# Patient Record
Sex: Male | Born: 1995 | Race: Black or African American | Hispanic: No | Marital: Single | Smoking: Never smoker
Health system: Southern US, Community
[De-identification: ages and names within clinical notes are randomized; demographics above are authoritative.]

---

## 2017-08-15 ENCOUNTER — Encounter (HOSPITAL_COMMUNITY): Payer: Self-pay

## 2017-08-15 ENCOUNTER — Other Ambulatory Visit: Payer: Self-pay

## 2017-08-15 ENCOUNTER — Emergency Department (HOSPITAL_COMMUNITY)
Admission: EM | Admit: 2017-08-15 | Discharge: 2017-08-15 | Disposition: A | Discharge status: Home / Self Care | Payer: Self-pay | Attending: Emergency Medicine | Admitting: Emergency Medicine

## 2017-08-15 DIAGNOSIS — Z7251 High risk heterosexual behavior: Secondary | ICD-10-CM

## 2017-08-15 DIAGNOSIS — F17210 Nicotine dependence, cigarettes, uncomplicated: Secondary | ICD-10-CM | POA: Insufficient documentation

## 2017-08-15 DIAGNOSIS — Z202 Contact with and (suspected) exposure to infections with a predominantly sexual mode of transmission: Secondary | ICD-10-CM | POA: Insufficient documentation

## 2017-08-15 LAB — URINALYSIS, ROUTINE W REFLEX MICROSCOPIC
BILIRUBIN URINE: NEGATIVE
GLUCOSE, UA: NEGATIVE mg/dL
HGB URINE DIPSTICK: NEGATIVE
Ketones, ur: NEGATIVE mg/dL
Leukocytes, UA: NEGATIVE
Nitrite: NEGATIVE
Protein, ur: NEGATIVE mg/dL
Specific Gravity, Urine: 1.004 — ABNORMAL LOW (ref 1.005–1.030)
pH: 6 (ref 5.0–8.0)

## 2017-08-15 NOTE — ED Provider Notes (Addendum)
[facility] EMERGENCY DEPARTMENT Provider Note   CSN: [id number] Arrival date & time: 08/15/17  0242    History   Chief Complaint Chief Complaint  Patient presents with  . Exposure to STD    HPI Roy Matthews is a 22 y.o. male.  22 year old male presents to the emergency department for evaluation of possible STD exposure.  He reports a sexual encounter with a male partner 2 days ago where he used a condom, but it broke during intercourse.  Patient denies any associated abdominal pain, fevers, nausea, vomiting, genital lesions, penile discharge, dysuria.  He states that he is solely concerned about the possibility of STDs.  No medications taken prior to arrival.     History reviewed. No pertinent past medical history.  There are no active problems to display for this patient.   History reviewed. No pertinent surgical history.      Home Medications    Prior to Admission medications   Not on File    Family History History reviewed. No pertinent family history.  Social History Social History   Tobacco Use  . Smoking status: Current Some Day Smoker  . Smokeless tobacco: Never Used  Substance Use Topics  . Alcohol use: Yes  . Drug use: Never     Allergies   Patient has no known allergies.   Review of Systems Review of Systems Ten systems reviewed and are negative for acute change, except as noted in the HPI.    Physical Exam Updated Vital Signs BP (!) 152/102 (BP Location: Right Arm)   Pulse (!) 111   Temp 98.8 F (37.1 C) (Oral)   Resp 16   Ht  (1.905 m)   Wt 72.6 kg (160 lb)   SpO2 98%   BMI 20.00 kg/m   Physical Exam  Constitutional: He is oriented to person, place, and time. He appears well-developed and well-nourished. No distress.  Nontoxic appearing and in NAD  HENT:  Head: Normocephalic and atraumatic.  Eyes: Conjunctivae and EOM are normal. No scleral icterus.  Neck: Normal range of motion.  Pulmonary/Chest:  Effort normal. No respiratory distress.  Respirations even and unlabored  Abdominal: He exhibits no distension.  Musculoskeletal: Normal range of motion.  Neurological: He is alert and oriented to person, place, and time. He exhibits normal muscle tone. Coordination normal.  Skin: Skin is warm and dry. No rash noted. He is not diaphoretic. No erythema. No pallor.  Psychiatric: He has a normal mood and affect. His behavior is normal.  Nursing note and vitals reviewed.    ED Treatments / Results  Labs (all labs ordered are listed, but only abnormal results are displayed) Labs Reviewed  URINALYSIS, ROUTINE W REFLEX MICROSCOPIC - Abnormal; Notable for the following components:      Result Value   Color, Urine STRAW (*)    Specific Gravity, Urine 1.004 (*)    All other components within normal limits  GC/CHLAMYDIA PROBE AMP ([place]) NOT AT [facility]    EKG None  Radiology No results found.  Procedures Procedures (including critical care time)  Medications Ordered in ED Medications - No data to display   Initial Impression / Assessment and Plan / ED Course  I have reviewed the triage vital signs and the nursing notes.  Pertinent labs & imaging results that were available during my care of the patient were reviewed by me and considered in my medical decision making (see chart for details).     22 year old male  presents to the emergency department for STD check.  He had a sexual encounter with a male partner 2 days ago where the condom broke during intercourse.  He has no symptomatic complaints today.  Urinalysis without pyuria or other evidence to suggest underlying STD.  Gonorrhea and Chlamydia cultures pending.  I do not believe he requires prophylactic treatment.  We will have the patient follow-up with the health department in 48 hours.  Return precautions discussed and provided. Patient discharged in stable condition with no unaddressed concerns.   Final Clinical  Impressions(s) / ED Diagnoses   Final diagnoses:  History of unprotected sex    ED Discharge Orders    None       Darylene Price 08/15/17 0615    Azalia Bilis, MD 08/15/17 0800    Antony Madura, PA-C 08/28/17 Caren Macadam    Azalia Bilis, MD 08/28/17 [phone]

## 2017-08-15 NOTE — Discharge Instructions (Addendum)
Your urine today did not suggest an acute infection. Follow-up with the health department in 48 hours for the results of your STD tests. If you test positive for STDs, notify all sexual partners of their need to be tested and treated as well and do not engage in sexual intercourse until 1 week after completion of treatment. Use a condom when sexually active.

## 2017-08-15 NOTE — ED Triage Notes (Signed)
Patient worried about a STD exposure.  Stated condom broke. States only one sexual partner. A&Ox4.  Denies any pain with urination or any discharge.

## 2017-11-11 ENCOUNTER — Other Ambulatory Visit: Payer: Self-pay

## 2017-11-11 ENCOUNTER — Emergency Department (HOSPITAL_COMMUNITY): Payer: Self-pay

## 2017-11-11 ENCOUNTER — Encounter (HOSPITAL_COMMUNITY): Payer: Self-pay

## 2017-11-11 ENCOUNTER — Emergency Department (HOSPITAL_COMMUNITY)
Admission: EM | Admit: 2017-11-11 | Discharge: 2017-11-11 | Disposition: A | Discharge status: Home / Self Care | Payer: Self-pay | Attending: Emergency Medicine | Admitting: Emergency Medicine

## 2017-11-11 DIAGNOSIS — N50812 Left testicular pain: Secondary | ICD-10-CM | POA: Insufficient documentation

## 2017-11-11 DIAGNOSIS — Z202 Contact with and (suspected) exposure to infections with a predominantly sexual mode of transmission: Secondary | ICD-10-CM | POA: Insufficient documentation

## 2017-11-11 DIAGNOSIS — R030 Elevated blood-pressure reading, without diagnosis of hypertension: Secondary | ICD-10-CM | POA: Insufficient documentation

## 2017-11-11 DIAGNOSIS — N50819 Testicular pain, unspecified: Secondary | ICD-10-CM

## 2017-11-11 DIAGNOSIS — F1210 Cannabis abuse, uncomplicated: Secondary | ICD-10-CM | POA: Insufficient documentation

## 2017-11-11 LAB — URINALYSIS, ROUTINE W REFLEX MICROSCOPIC
Bilirubin Urine: NEGATIVE
Glucose, UA: NEGATIVE mg/dL
Hgb urine dipstick: NEGATIVE
Ketones, ur: 80 mg/dL — AB
Leukocytes, UA: NEGATIVE
Nitrite: NEGATIVE
Protein, ur: NEGATIVE mg/dL
Specific Gravity, Urine: 1.028 (ref 1.005–1.030)
pH: 6 (ref 5.0–8.0)

## 2017-11-11 MED ORDER — LIDOCAINE HCL (PF) 1 % IJ SOLN
INTRAMUSCULAR | Status: AC
  Filled 2017-11-11: qty 5

## 2017-11-11 MED ORDER — AZITHROMYCIN 250 MG PO TABS
1000.0000 mg | ORAL_TABLET | Freq: Once | ORAL | Status: AC
  Administered 2017-11-11: 1000 mg via ORAL
  Filled 2017-11-11: qty 4

## 2017-11-11 MED ORDER — CEFTRIAXONE SODIUM 250 MG IJ SOLR
250.0000 mg | Freq: Once | INTRAMUSCULAR | Status: AC
  Administered 2017-11-11: 250 mg via INTRAMUSCULAR
  Filled 2017-11-11: qty 250

## 2017-11-11 NOTE — ED Provider Notes (Signed)
[facility] EMERGENCY DEPARTMENT Provider Note   CSN: [id number] Arrival date & time: 11/11/17  1322     History   Chief Complaint Chief Complaint  Patient presents with  . Testicle Pain    HPI Roy Matthews is a 22 y.o. male presenting for bilateral testicular pain that began yesterday around 2 PM.  He states that he sat up and felt a heaviness in his testicles that has been constant and mild heaviness 2/10 in severity.  Patient states that nothing makes the pain worse or better.  Patient states that he has not taken anything for his pain.  Patient states that he had unprotected sex approximately 1 week ago and is concerned that he may have an STD.  Patient denies fevers, penile pain or drainage, testicular swelling, rash or new lesions.  HPI  History reviewed. No pertinent past medical history.  There are no active problems to display for this patient.   History reviewed. No pertinent surgical history.      Home Medications    Prior to Admission medications   Not on File    Family History History reviewed. No pertinent family history.  Social History Social History   Tobacco Use  . Smoking status: Never Smoker  . Smokeless tobacco: Never Used  Substance Use Topics  . Alcohol use: Yes  . Drug use: Yes    Types: Marijuana    Comment: every day     Allergies   Patient has no known allergies.   Review of Systems Review of Systems  Constitutional: Negative.  Negative for chills and fever.  HENT: Negative.  Negative for rhinorrhea and sore throat.   Eyes: Negative.  Negative for visual disturbance.  Respiratory: Negative.  Negative for cough and shortness of breath.   Cardiovascular: Negative.  Negative for chest pain.  Gastrointestinal: Negative.  Negative for abdominal pain, blood in stool, diarrhea, nausea and vomiting.  Genitourinary: Positive for testicular pain. Negative for discharge, dysuria, flank pain, hematuria, penile pain,  penile swelling and scrotal swelling.  Musculoskeletal: Negative.  Negative for arthralgias and myalgias.  Skin: Negative.  Negative for rash.  Neurological: Negative.  Negative for dizziness, weakness and headaches.  All other systems reviewed and are negative.    Physical Exam Updated Vital Signs BP (!) 156/98 (BP Location: Right Arm)   Pulse 80   Temp 98 F (36.7 C) (Oral)   Resp 16   Ht 6\' 4"  (1.93 m)   Wt 72.6 kg   SpO2 100%   BMI 19.48 kg/m   Physical Exam  Constitutional: He is oriented to person, place, and time. He appears well-developed and well-nourished. No distress.  HENT:  Head: Normocephalic and atraumatic.  Right Ear: External ear normal.  Left Ear: External ear normal.  Nose: Nose normal.  Eyes: Pupils are equal, round, and reactive to light. EOM are normal.  Neck: Trachea normal and normal range of motion. No tracheal deviation present.  Pulmonary/Chest: Effort normal. No respiratory distress.  Abdominal: Soft. There is no tenderness. There is no rebound and no guarding. Hernia confirmed negative in the right inguinal area and confirmed negative in the left inguinal area.  Genitourinary: Penis normal. Cremasteric reflex is present. Right testis shows tenderness. Right testis shows no mass and no swelling. Cremasteric reflex is not absent on the right side. Left testis shows tenderness. Left testis shows no mass and no swelling. Cremasteric reflex is not absent on the left side. Circumcised. No penile tenderness.  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  No external genital lesions noted, no bumps on head of penis, specifically no vesicles concerning for herpes or chancre suggestive of syphilis.  No pain with palpation of the penis/glans, no discharge or urethritis noted.  Scrotum and testicles without erythema/swelling or tenderness to palpation. Cremasteric reflex intact bilaterally. No palpable hernia noted.      Musculoskeletal: Normal range of motion.  Neurological: He is alert and oriented to person, place, and time.  Skin: Skin is warm and dry.  Psychiatric: He has a normal mood and affect. His behavior is normal.     ED Treatments / Results  Labs (all labs ordered are listed, but only abnormal results are displayed) Labs Reviewed  URINALYSIS, ROUTINE W REFLEX MICROSCOPIC - Abnormal; Notable for the following components:      Result Value   Ketones, ur 80 (*)    All other components within normal limits  RPR  HIV ANTIBODY (ROUTINE TESTING)  GC/CHLAMYDIA PROBE AMP ([place]) NOT AT [facility]    EKG None  Radiology US Scrotum W/doppler  Result Date: 11/11/2017 CLINICAL DATA:  Bilateral testicular pain over the last 2 days. EXAM: SCROTAL ULTRASOUND DOPPLER ULTRASOUND OF THE TESTICLES TECHNIQUE: Complete ultrasound examination of the testicles, epididymis, and other scrotal structures was performed. Color and spectral Doppler ultrasound were also utilized to evaluate blood flow to the testicles. COMPARISON:  None. FINDINGS: Right testicle Measurements: 4.5 x 2.9 x 3.0 cm. Normal morphology. Normal color flow and Doppler pattern. No mass or inflammatory change. Left testicle Measurements: 4.3 x 2.3 x 3.1 cm. Normal morphology. Normal color flow and Doppler pattern. No mass or inflammatory change. Right epididymis:  Normal in size and appearance. Left epididymis:  Normal in size and appearance. Hydrocele:  None significant Varicocele:  None Pulsed Doppler interrogation of both testes demonstrates normal low resistance arterial and venous waveforms bilaterally. IMPRESSION: Normal scrotal ultrasound. No evidence of epididymo-orchitis, torsion, mass or other anatomic pathology. Electronically Signed   By: Paulina Fusi M.D.   On: 11/11/2017 15:27    Procedures Procedures (including critical care time)  Medications Ordered in ED Medications  lidocaine (PF) (XYLOCAINE) 1 % injection (has no  administration in time range)  cefTRIAXone (ROCEPHIN) injection 250 mg (250 mg Intramuscular Given 11/11/17 1704)  azithromycin (ZITHROMAX) tablet 1,000 mg (1,000 mg Oral Given 11/11/17 1704)     Initial Impression / Assessment and Plan / ED Course  I have reviewed the triage vital signs and the nursing notes.  Pertinent labs & imaging results that were available during my care of the patient were reviewed by me and considered in my medical decision making (see chart for details).    Patient treated in the ED for mild testicular pain and concern for STI.    Ultrasound scrotum with Doppler negative.  Physical examination unremarkable.    Patient treated with azithromycin and Rocephin in department.  Urinalysis negative for trichomonas.  Patient advised to inform and treat all sexual partners.  Pt advised on safe sex practices and understands that they have GC/Chlamydia cultures pending and will result in 2-3 days. HIV and RPR sent. Patient encouraged to follow up at local health department for future STI checks. No concern for prostatitis or epididymitis at this time. Discussed return precautions. Pt appears safe for discharge.  At this time there does not appear to be any evidence of an acute emergency medical condition and the patient appears stable for discharge with appropriate outpatient follow  up. Diagnosis was discussed with patient who verbalizes understanding of care plan and is agreeable to discharge. I have discussed return precautions with patient who verbalizes understanding of return precautions. Patient strongly encouraged to follow-up with their PCP. All questions answered.  Note: Portions of this report may have been transcribed using voice recognition software. Every effort was made to ensure accuracy; however, inadvertent computerized transcription errors may still be present.   Final Clinical Impressions(s) / ED Diagnoses   Final diagnoses:  Testicular pain  Exposure to  STD    ED Discharge Orders    None       Elizabeth Palau 11/11/17 1741    Wynetta Fines, MD 11/12/17 [phone]

## 2017-11-11 NOTE — Discharge Instructions (Addendum)
Please return to the Emergency Department for any new or worsening symptoms or if your symptoms do not improve. Please be sure to follow up with your Primary Care Physician as soon as possible regarding your visit today. If you do not have a Primary Doctor please use the resources below to establish one. Please follow-up with the urology office as soon as you can for further evaluation. You have been treated today with Rocephin and azithromycin for presumed STD.  Your cultures of gonorrhea and chlamydia will not result for the next few days, you will be contacted by [facility] for positive results only.  Your HIV and syphilis tests are also pending at this time you will be contacted by [facility] for positive results as well.  Please follow-up with health department for future STD checks. Please be sure that all of your partners are treated and tested for STDs as well.  Please abstain from sexual activity for at least 10 days and make sure all partners are treated before sexual activity. Your blood pressure was slightly increased today, be sure to follow-up with your primary care provider for a blood pressure recheck.  Contact a health care provider if: See your health care provider. Tell your sexual partner(s). They should be tested and treated for any STDs. Do not have sex until your health care provider says it is okay. Get help right away if: Contact your health care provider right away if: You have severe abdominal pain. You are a man and notice swelling or pain in your testicles. You are a woman and notice swelling or pain in your vagina.  RESOURCE GUIDE  Chronic Pain Problems: Contact [facility]  [phone] Patients need to be referred by their primary care doctor.  Insufficient Money for Medicine: Contact United Way:  call "211" or [facility] [phone].  No Primary Care Doctor: Call [facility] Connect  [phone] - can help you locate a primary care doctor that  accepts  your insurance, provides certain services, etc. Physician Referral Service- [phone]  Agencies that provide inexpensive medical care: [facility]  [phone] [facility]  [phone] [facility]  [phone] [facility]  [phone] [facility]  [phone] [facility]  [phone]  Medicaid-accepting [facility] Providers: [facility]- [address], [address]  [phone], Mon-Fri 9am-7pm, Sat 9am-1pm [facility]- [address], [address] [place]  [phone] [facility]- [address] [place]  [phone] [facility]- [address]  [phone] Renaye Rakers- [address] [place], [address], [phone]  Only accepts [place] patients after they have their name  applied to their card  Self Pay (no insurance) in [facility]: Sickle Cell Patients: Dr Willey Blade, [facility]  [address] [place], [phone] [facility]- [address] [place]  [phone]       [facility] [place]- [address], [address]       -     [facility]- see information above (Speak to [employer] if you do not have insurance)       -  [facility]- [address] [place], [phone]       -  [facility]- [address],  [phone]       -  [facility]- [phone] [address]  [address], [phone]       -  Dr Julio Sicks-  [address], [place], [phone]       -  [facility]- [address], [phone]       -  [facility]- [address], [phone], also [address], [phone]       -    [facility]- [address] [place], [phone], 1st & 3rd Saturday   every month, 10am-1pm  1) Find a Doctor and Pay Out of Pocket Although you won't have to find out who is covered by your insurance plan, it is a good idea to ask around and get  recommendations. You will then need to call the office and see if the doctor you have chosen will accept you as a new patient and what types of options they offer for patients who are self-pay. Some doctors offer discounts or will set up payment plans for their patients who do not have insurance, but you will need to ask so you aren't surprised when you get to your appointment.  2) Contact Your Local Health Department Not all health departments have doctors that can see patients for sick visits, but many do, so it is worth a call to see if yours does. If you don't know where your local health department is, you can check in your phone book. The CDC also has a tool to help you locate your state's health department, and many state websites also have listings of all of their local health departments.  3) Find a Walk-in Clinic If your illness is not likely to be very severe or complicated, you may want to try a walk in clinic. These are popping up all over the country in pharmacies, drugstores, and shopping centers. They're usually staffed by nurse practitioners or physician assistants that have been trained to treat common illnesses and complaints. They're usually fairly quick and inexpensive. However, if you have serious medical issues or chronic medical problems, these are probably not your best option  STD Testing [facility] Department of [facility], STD Clinic, [address] [place], phone [phone] or [phone].  Monday - Friday, call for an appointment. [facility] of [employer], STD Clinic, [address], [place], phone [phone] or [phone].  Monday - Friday, call for an appointment.  Abuse/Neglect: [facility] Child Abuse Hotline (850)163-8420 [facility] Child Abuse Hotline 907-561-2423 (After Hours)  Emergency Shelter:  [facility] 272-454-4031  Maternity Homes: [facility]  (220) 860-2427 [facility] [phone]  MRSA Hotline #:   (952)360-6301  [facility] Resources  [facility]  [employer] [facility] Dept. [address]                 [address]         [address] [place] [address]  [place]                                               Cristobal Goldmann Phone:  [phone]  Phone:  [phone]                   Phone:  [phone]  [facility], [phone] [facility]- [phone]       -     [facility] in [place], [address],                                  [phone], [employer]urance  [place] Child Abuse Hotline 365-192-8340(336) (289) 327-2110 or (937) 527-2672(336) (475)876-8064 (After Hours)   Behavioral Health Services  Substance Abuse Resources: [facility]  (252)145-8772418-372-6894 [facility] [phone] [facility] [phone] [facility] [phone] Residential & Outpatient Substance Abuse Program  [phone]  Psychological Services: [facility]  [phone] [facility]  [phone] [facility], [phone] [place] [address], [place], ACCESS LINE: ([phone] or (867) [phone], [email or web address]  Dental Assistance  If unable to pay or uninsured, contact:  [facility] or [facility] to become qualified for the adult dental clinic.  Patients with Medicaid: [facility] [phone], [phone] [address], [phone]  If unable to pay, or uninsured, contact [facility] [phone]) or [facility] [phone] in [place], [phone] in [facility]) to become qualified for the adult dental clinic   Other Low-Cost Community Dental Services: [facility]- [address], [place], [place], [address],  [phone], [phone], 2nd and 4th Thursday of the month at 6:30am.  10 clients each day by appointment, can sometimes see walk-in patients if someone does not show for an appointment. [facility]- [address], [place], [place], [phone], [phone] [facility]- [address], [place], [place], [id number], [phone] [facility]- [phone] [facility]- [phone] [phone]- [phone]

## 2017-11-11 NOTE — ED Triage Notes (Signed)
Pt endorses bilteral testicle discomfort "feels like someone is grabbing them but not squeezing hard" Denies discharge. Has been having unprotected sex and endorse hx of std's but this feels different. VSS.

## 2017-11-12 LAB — GC/CHLAMYDIA PROBE AMP (~~LOC~~) NOT AT ARMC
Chlamydia: NEGATIVE
Neisseria Gonorrhea: NEGATIVE

## 2017-11-12 LAB — RPR: RPR: NONREACTIVE

## 2017-11-12 LAB — HIV ANTIBODY (ROUTINE TESTING W REFLEX): HIV Screen 4th Generation wRfx: NONREACTIVE

## 2018-04-25 ENCOUNTER — Emergency Department (HOSPITAL_COMMUNITY)
Admission: EM | Admit: 2018-04-25 | Discharge: 2018-04-25 | Disposition: A | Discharge status: Home / Self Care | Payer: Self-pay | Attending: Emergency Medicine | Admitting: Emergency Medicine

## 2018-04-25 ENCOUNTER — Encounter (HOSPITAL_COMMUNITY): Payer: Self-pay | Admitting: Emergency Medicine

## 2018-04-25 ENCOUNTER — Emergency Department (HOSPITAL_COMMUNITY): Payer: Self-pay

## 2018-04-25 DIAGNOSIS — R1033 Periumbilical pain: Secondary | ICD-10-CM | POA: Insufficient documentation

## 2018-04-25 DIAGNOSIS — R109 Unspecified abdominal pain: Secondary | ICD-10-CM

## 2018-04-25 LAB — CBC
HCT: 50.3 % (ref 39.0–52.0)
Hemoglobin: 16.4 g/dL (ref 13.0–17.0)
MCH: 28.9 pg (ref 26.0–34.0)
MCHC: 32.6 g/dL (ref 30.0–36.0)
MCV: 88.6 fL (ref 80.0–100.0)
PLATELETS: 177 10*3/uL (ref 150–400)
RBC: 5.68 MIL/uL (ref 4.22–5.81)
RDW: 12.7 % (ref 11.5–15.5)
WBC: 5.1 10*3/uL (ref 4.0–10.5)
nRBC: 0 % (ref 0.0–0.2)

## 2018-04-25 LAB — COMPREHENSIVE METABOLIC PANEL
ALK PHOS: 71 U/L (ref 38–126)
ALT: 13 U/L (ref 0–44)
AST: 17 U/L (ref 15–41)
Albumin: 4.2 g/dL (ref 3.5–5.0)
Anion gap: 12 (ref 5–15)
BUN: 10 mg/dL (ref 6–20)
CALCIUM: 9.4 mg/dL (ref 8.9–10.3)
CO2: 26 mmol/L (ref 22–32)
Chloride: 97 mmol/L — ABNORMAL LOW (ref 98–111)
Creatinine, Ser: 1.18 mg/dL (ref 0.61–1.24)
GFR calc non Af Amer: >60 mL/min (ref >60)
GLUCOSE: 113 mg/dL — AB (ref 70–99)
Potassium: 4.3 mmol/L (ref 3.5–5.1)
SODIUM: 135 mmol/L (ref 135–145)
Total Bilirubin: 1.2 mg/dL (ref 0.3–1.2)
Total Protein: 7.2 g/dL (ref 6.5–8.1)

## 2018-04-25 LAB — URINALYSIS, ROUTINE W REFLEX MICROSCOPIC
Bilirubin Urine: NEGATIVE
GLUCOSE, UA: NEGATIVE mg/dL
HGB URINE DIPSTICK: NEGATIVE
KETONES UR: 5 mg/dL — AB
Leukocytes, UA: NEGATIVE
Nitrite: NEGATIVE
PROTEIN: NEGATIVE mg/dL
Specific Gravity, Urine: 1.033 — ABNORMAL HIGH (ref 1.005–1.030)
pH: 6 (ref 5.0–8.0)

## 2018-04-25 LAB — LIPASE, BLOOD: Lipase: 17 U/L (ref 11–51)

## 2018-04-25 MED ORDER — DICYCLOMINE HCL 10 MG PO CAPS
10.0000 mg | ORAL_CAPSULE | Freq: Once | ORAL | Status: AC
  Administered 2018-04-25: 10 mg via ORAL
  Filled 2018-04-25: qty 1

## 2018-04-25 MED ORDER — SODIUM CHLORIDE 0.9% FLUSH: 3.0000 mL | Freq: Once | INTRAVENOUS | Status: DC

## 2018-04-25 MED ORDER — NAPROXEN 250 MG PO TABS
500.0000 mg | ORAL_TABLET | Freq: Once | ORAL | Status: AC
  Administered 2018-04-25: 500 mg via ORAL
  Filled 2018-04-25: qty 2

## 2018-04-25 MED ORDER — PROMETHAZINE HCL 25 MG PO TABS: 25.0000 mg | ORAL_TABLET | Freq: Four times a day (QID) | ORAL | 0 refills | Status: AC | PRN

## 2018-04-25 NOTE — ED Triage Notes (Signed)
Pt reports gen abd pain with N/V onset yesterday.

## 2018-04-25 NOTE — ED Notes (Signed)
Drinking ginger ale for po challenge at this time.

## 2018-04-25 NOTE — Discharge Instructions (Signed)
Avoid fried foods, fatty foods, greasy foods, and milk products until symptoms resolve. Be sure you drink plenty of clear liquids. We recommend the use of Phenergan as prescribed for nausea/vomiting. Take tylenol or ibuprofen for management of abdominal pain.  Follow-up with your pediatrician to ensure resolution of symptoms.

## 2018-04-25 NOTE — ED Provider Notes (Signed)
[facility] EMERGENCY DEPARTMENT Provider Note   CSN: [id number] Arrival date & time: 04/25/18  0003     History   Chief Complaint Chief Complaint  Patient presents with  . Abdominal Pain    HPI Roy Matthews is a 23 y.o. male.  23 year old male with no significant past medical history presents to the emergency department for evaluation of abdominal pain which began approximately 24 hours ago.  It has been fairly constant and waxing and waning in severity.  Abdominal discomfort is periumbilical and was associated with one episode of vomiting upon waking this morning.  Reports that last bowel movement was 2 to 3 days ago.  States that it is not unusual for him to not have a bowel movement daily.  Denies fevers, urinary symptoms, history of abdominal surgeries.  He has not taken any medications for his symptoms prior to arrival.  The history is provided by the patient. No language interpreter was used.  Abdominal Pain    History reviewed. No pertinent past medical history.  There are no active problems to display for this patient.   History reviewed. No pertinent surgical history.      Home Medications    Prior to Admission medications   Medication Sig Start Date End Date Taking? Authorizing Provider  promethazine (PHENERGAN) 25 MG tablet Take 1 tablet (25 mg total) by mouth every 6 (six) hours as needed for nausea or vomiting. 04/25/18   Antony Madura, PA-C    Family History No family history on file.  Social History Social History   Tobacco Use  . Smoking status: Never Smoker  . Smokeless tobacco: Never Used  Substance Use Topics  . Alcohol use: Yes    Comment: Socially   . Drug use: Yes    Types: Marijuana    Comment: every day     Allergies   Patient has no known allergies.   Review of Systems Review of Systems  Gastrointestinal: Positive for abdominal pain.  Ten systems reviewed and are negative for acute change, except as noted in  the HPI.    Physical Exam Updated Vital Signs BP 138/80 (BP Location: Right Arm)   Pulse 88   Temp 98.7 F (37.1 C) (Oral)   Resp 18   Ht 6\' 4"  (1.93 m)   Wt 74.8 kg   SpO2 98%   BMI 20.08 kg/m   Physical Exam Vitals signs and nursing note reviewed.  Constitutional:      General: He is not in acute distress.    Appearance: He is well-developed. He is not diaphoretic.     Comments: Nontoxic appearing and in NAD  HENT:     Head: Normocephalic and atraumatic.  Eyes:     General: No scleral icterus.    Conjunctiva/sclera: Conjunctivae normal.  Neck:     Musculoskeletal: Normal range of motion.  Cardiovascular:     Rate and Rhythm: Normal rate and regular rhythm.     Pulses: Normal pulses.  Pulmonary:     Effort: Pulmonary effort is normal. No respiratory distress.     Breath sounds: No stridor. No wheezing.     Comments: Respirations even and unlabored Abdominal:     Comments: Abdomen soft, nondistended. No focal abdominal TTP.  Musculoskeletal: Normal range of motion.  Skin:    General: Skin is warm and dry.     Coloration: Skin is not pale.     Findings: No erythema or rash.  Neurological:     Mental  Status: He is alert and oriented to person, place, and time.     Coordination: Coordination normal.     Comments: Moving all extremities spontaneously.  Psychiatric:        Behavior: Behavior normal.      ED Treatments / Results  Labs (all labs ordered are listed, but only abnormal results are displayed) Labs Reviewed  COMPREHENSIVE METABOLIC PANEL - Abnormal; Notable for the following components:      Result Value   Chloride 97 (*)    Glucose, Bld 113 (*)    All other components within normal limits  URINALYSIS, ROUTINE W REFLEX MICROSCOPIC - Abnormal; Notable for the following components:   Specific Gravity, Urine 1.033 (*)    Ketones, ur 5 (*)    All other components within normal limits  LIPASE, BLOOD  CBC    EKG None  Radiology Dg Abd 2  Views  Result Date: 04/25/2018 CLINICAL DATA:  Generalized abdominal pain for 1 day. Vomiting today. EXAM: ABDOMEN - 2 VIEW COMPARISON:  None. FINDINGS: Gas and stool throughout the colon without distention. There is a single gas distended small bowel loop in the mid left abdomen. There is suggestion of wall thickening. This could indicate localized ileus due to enteritis or early obstruction. No free intra-abdominal air. No abnormal air-fluid levels. No radiopaque stones. Visualized bones and soft tissue contours appear intact. IMPRESSION: Single gas distended small bowel loop in the mid left abdomen with suggestion of wall thickening. This could indicate enteritis or early obstruction. Electronically Signed   By: Burman Nieves M.D.   On: 04/25/2018 02:33    Procedures Procedures (including critical care time)  Medications Ordered in ED Medications  sodium chloride flush (NS) 0.9 % injection 3 mL (has no administration in time range)  dicyclomine (BENTYL) capsule 10 mg (10 mg Oral Given 04/25/18 0251)  naproxen (NAPROSYN) tablet 500 mg (500 mg Oral Given 04/25/18 0251)     Initial Impression / Assessment and Plan / ED Course  I have reviewed the triage vital signs and the nursing notes.  Pertinent labs & imaging results that were available during my care of the patient were reviewed by me and considered in my medical decision making (see chart for details).     Patient with symptoms consistent with viral illness.  Xray suggestive of enteritis. Patient with emesis this AM, but none since. Continues to pass flatus without difficulty. Vitals are stable, no fever. No clinical signs of dehydration, tolerating PO fluids in the ED. Lungs are clear. No focal abdominal pain.  Labs reassuring.  Doubt appendicitis, cholecystitis, pancreatitis, ruptured viscus, UTI, kidney stone, or other emergent abdominal etiology.  Plan for outpatient supportive care with tylenol/motrin and phenergan.  Return  precautions discussed and provided. Patient discharged in stable condition with no unaddressed concerns.   Final Clinical Impressions(s) / ED Diagnoses   Final diagnoses:  Abdominal pain    ED Discharge Orders         Ordered    promethazine (PHENERGAN) 25 MG tablet  Every 6 hours PRN     04/25/18 0357           Antony Madura, PA-C 04/25/18 0422    Ward, Layla Maw, DO 04/25/18 [phone]

## 2019-01-23 IMAGING — US US SCROTUM W/ DOPPLER COMPLETE
1 series · 14 of 25 positions shown · non-contrast
Comparison: None.

CLINICAL DATA: Bilateral testicular pain over the last 2 days.

EXAM:
SCROTAL ULTRASOUND
DOPPLER ULTRASOUND OF THE TESTICLES
TECHNIQUE: Complete ultrasound examination of the testicles, epididymis, and
other scrotal structures was performed. Color and spectral Doppler
ultrasound were also utilized to evaluate blood flow to the
testicles.

[Series 1: us scrotum w/ doppler complete · 0.06mm/px · 14 of 54 slices shown]
[im 1/54]
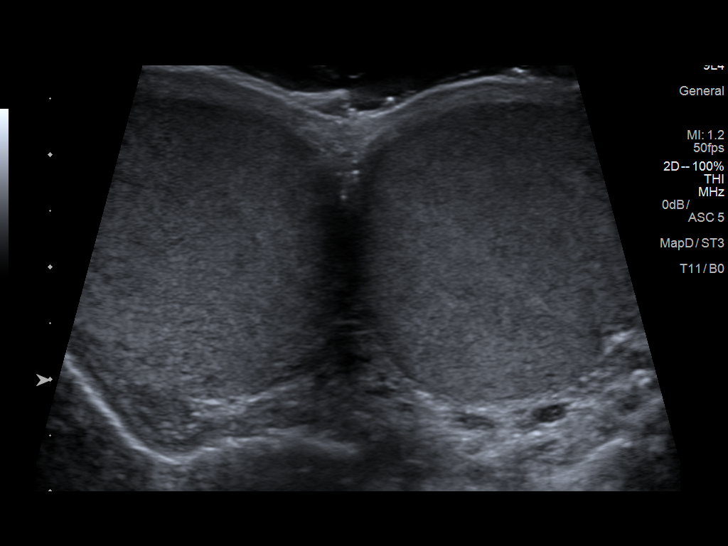
[im 5/54]
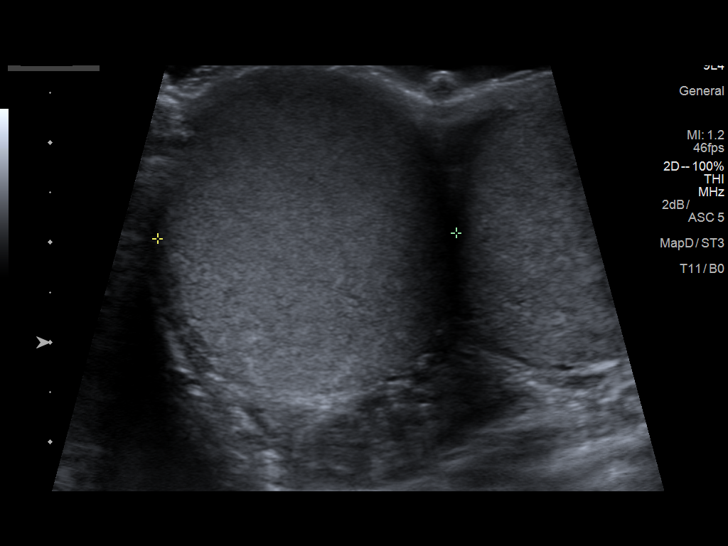
[im 9/54]
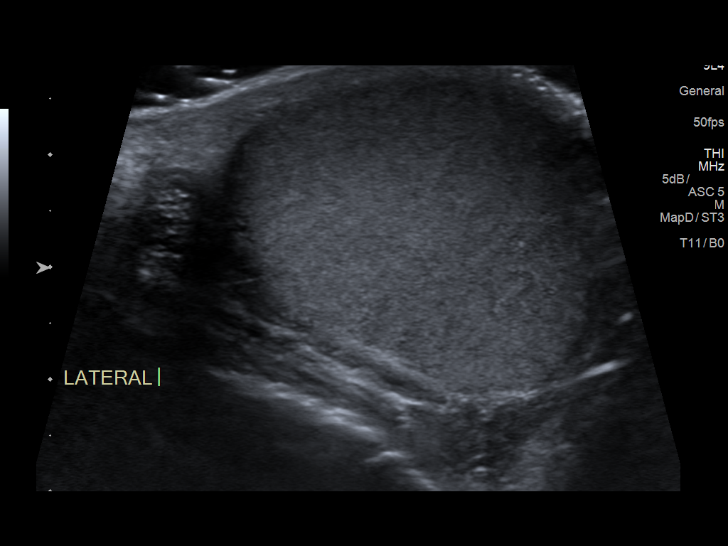
[im 14/54]
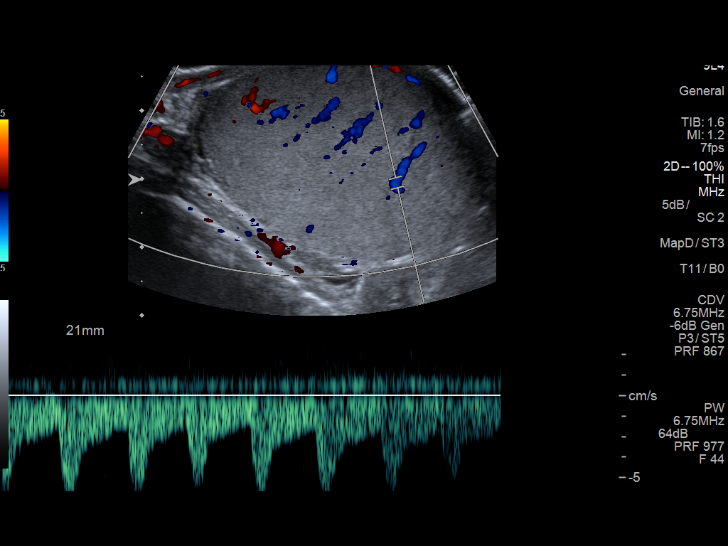
[im 18/54]
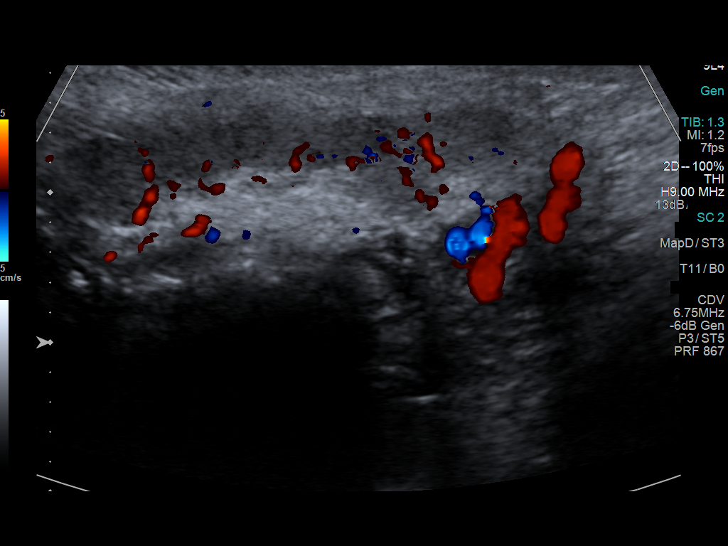
[im 20/54]
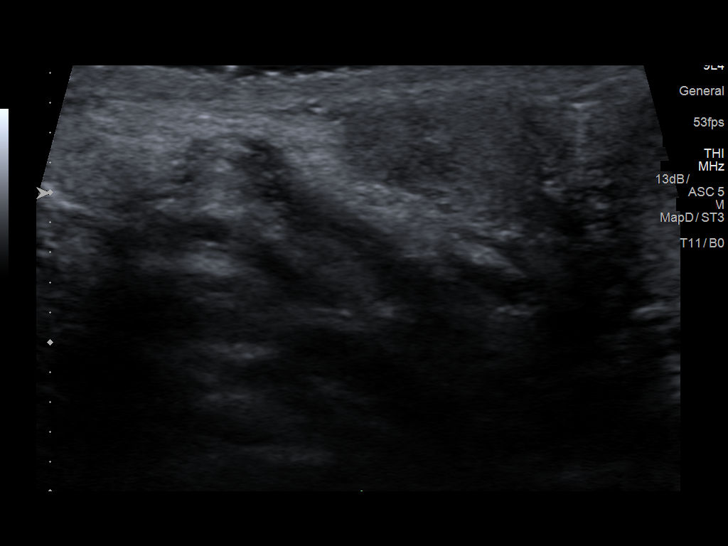
[im 25/54]
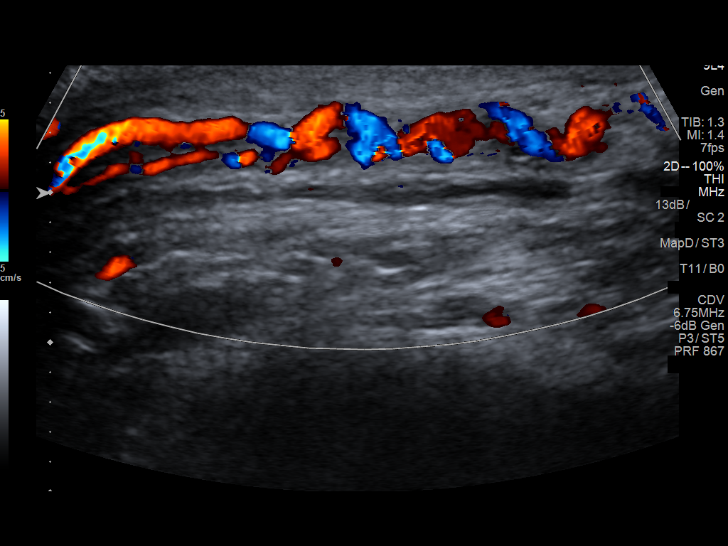
[im 29/54]
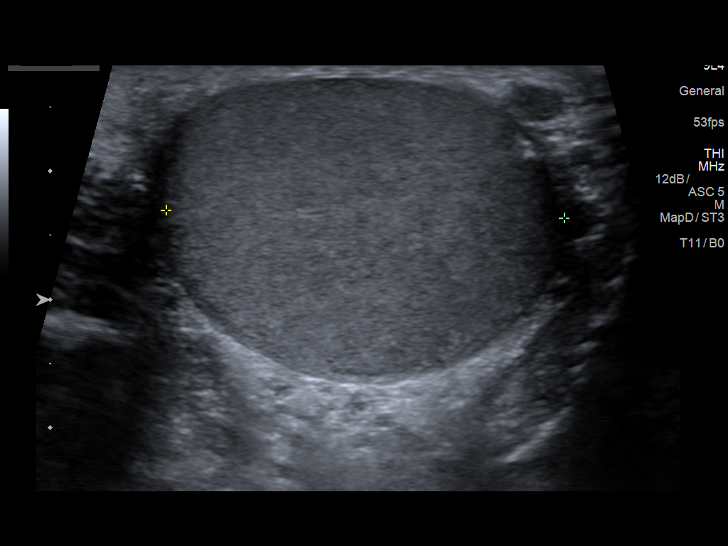
[im 34/54]
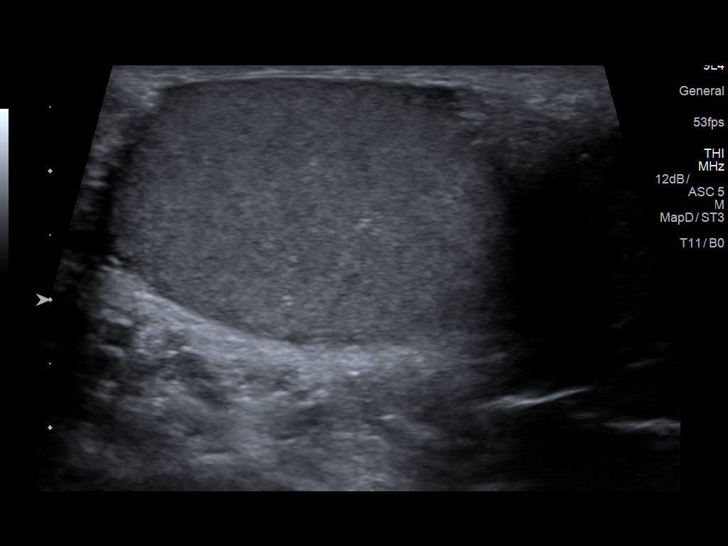
[im 36/54]
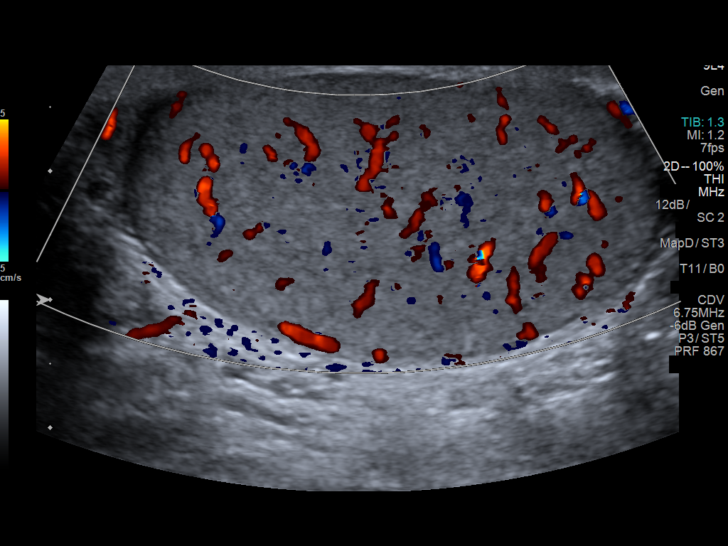
[im 40/54]
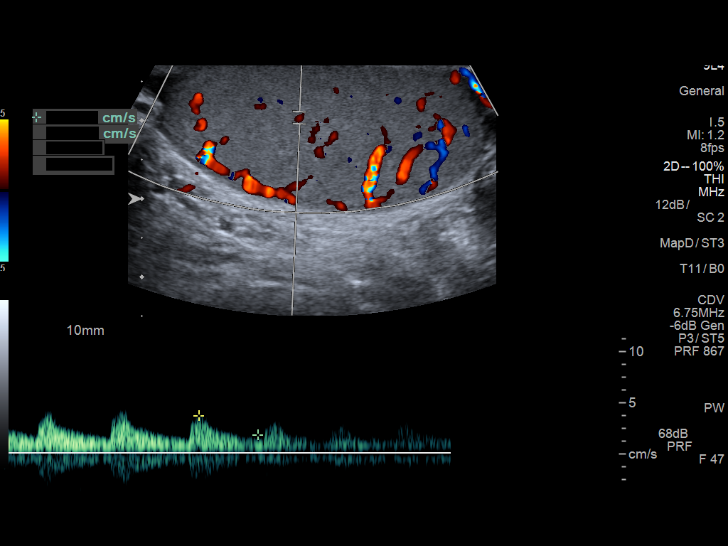
[im 45/54]
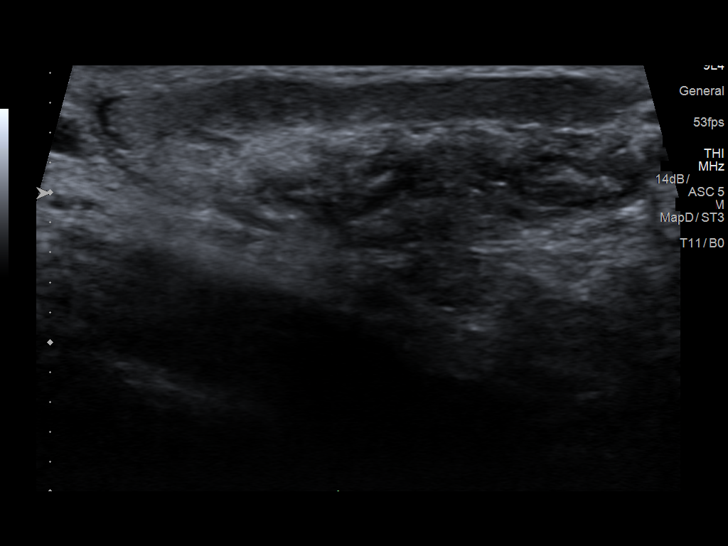
[im 49/54]
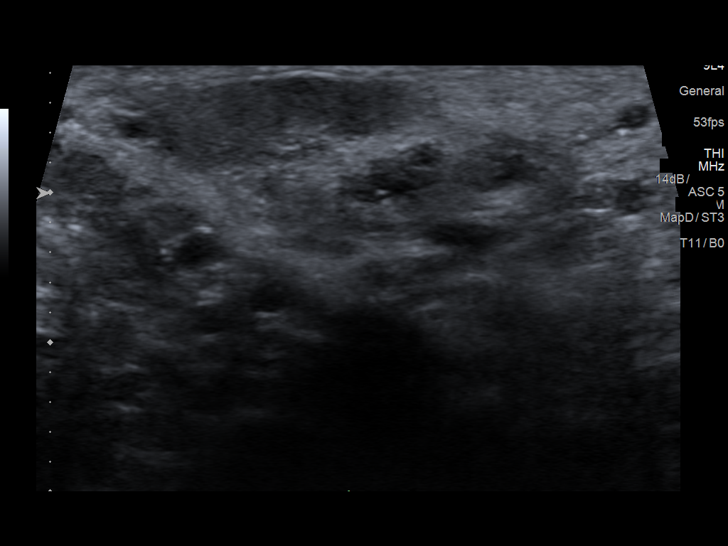
[im 54/54]
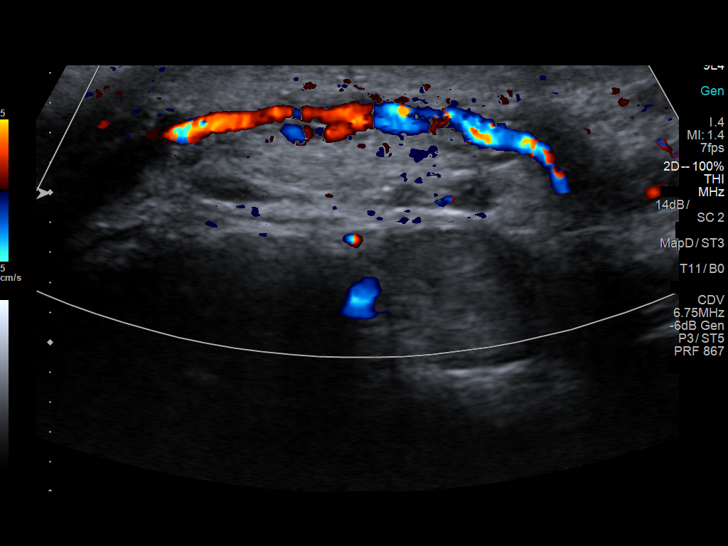

[14 of 25 positions shown; findings below may reference images not displayed]

FINDINGS: Right testicle

Measurements: 4.5 x 2.9 x 3.0 cm. Normal morphology. Normal color
flow and Doppler pattern. No mass or inflammatory change.

Left testicle

Measurements: 4.3 x 2.3 x 3.1 cm.. Normal morphology. Normal color
flow and Doppler pattern. No mass or inflammatory change.

Right epididymis:  Normal in size and appearance.

Left epididymis:  Normal in size and appearance.

Hydrocele:  None significant

Varicocele:  None

Pulsed Doppler interrogation of both testes demonstrates normal low
resistance arterial and venous waveforms bilaterally.
IMPRESSION: Normal scrotal ultrasound. No evidence of epididymo-orchitis,
torsion, mass or other anatomic pathology.

## 2020-06-23 IMAGING — DX DG ABDOMEN 2V
2 series · 3 of 3 positions shown · non-contrast
Comparison: None.

CLINICAL DATA: Generalized abdominal pain for 1 day. Vomiting
today.

EXAM:
ABDOMEN - 2 VIEW

[abdomen erect]
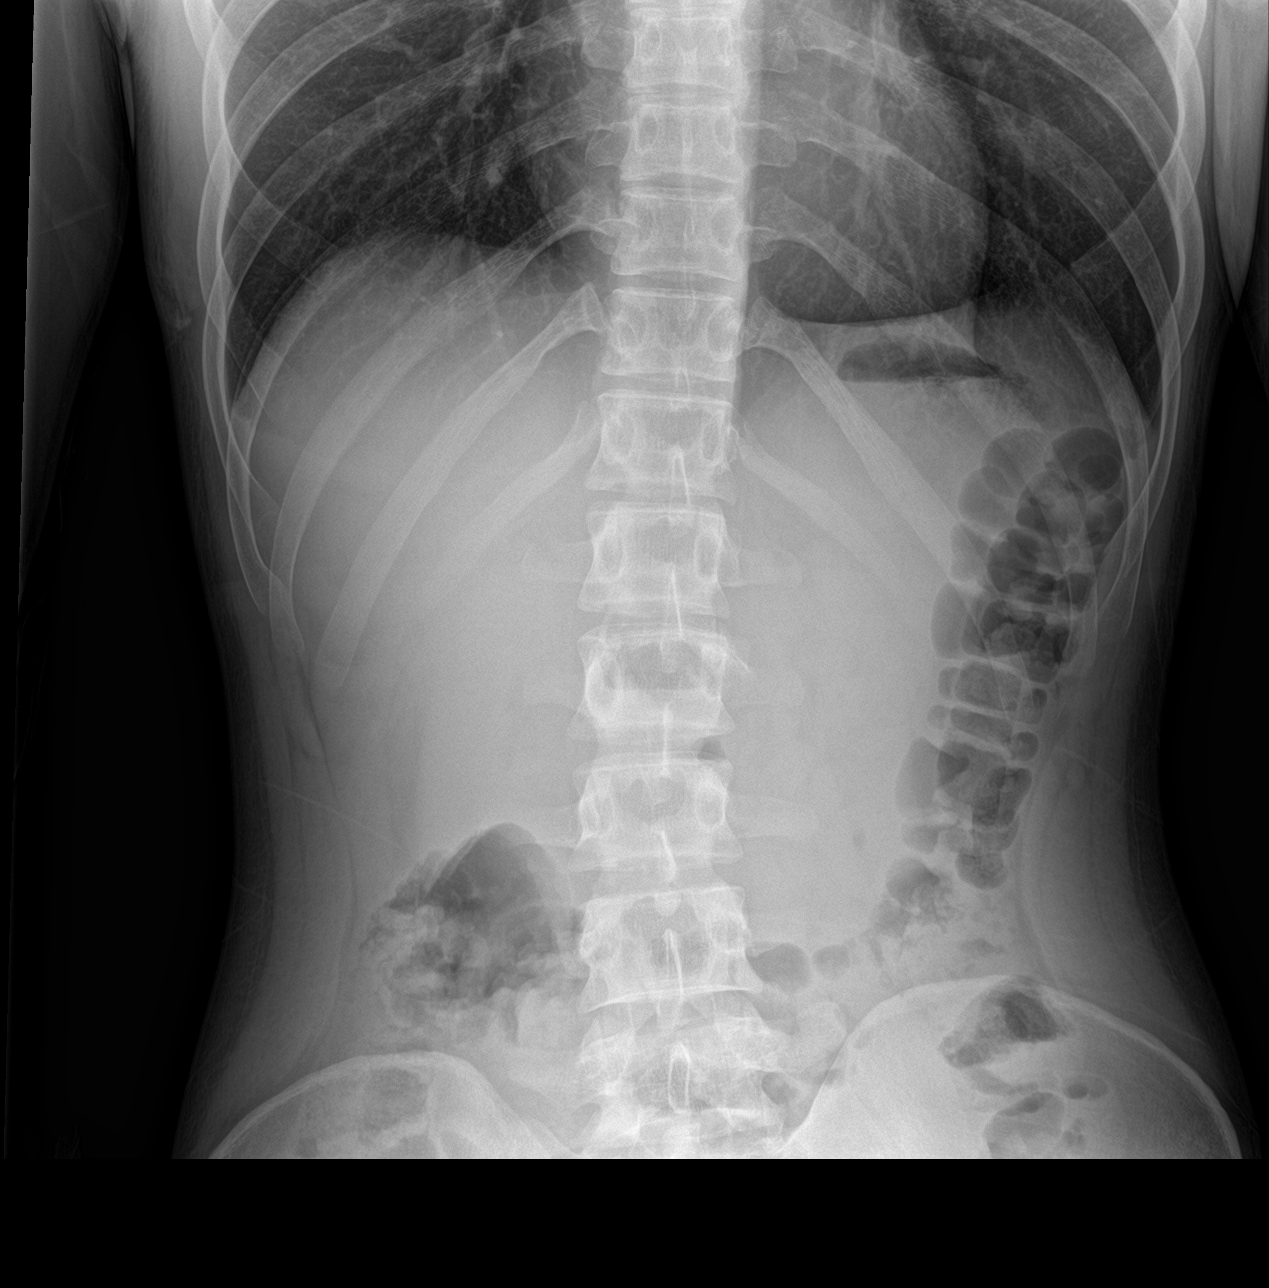

[Series 2: abdomen supine · 0.14mm/px · 2 of 2 slices shown]
[im 1/2]
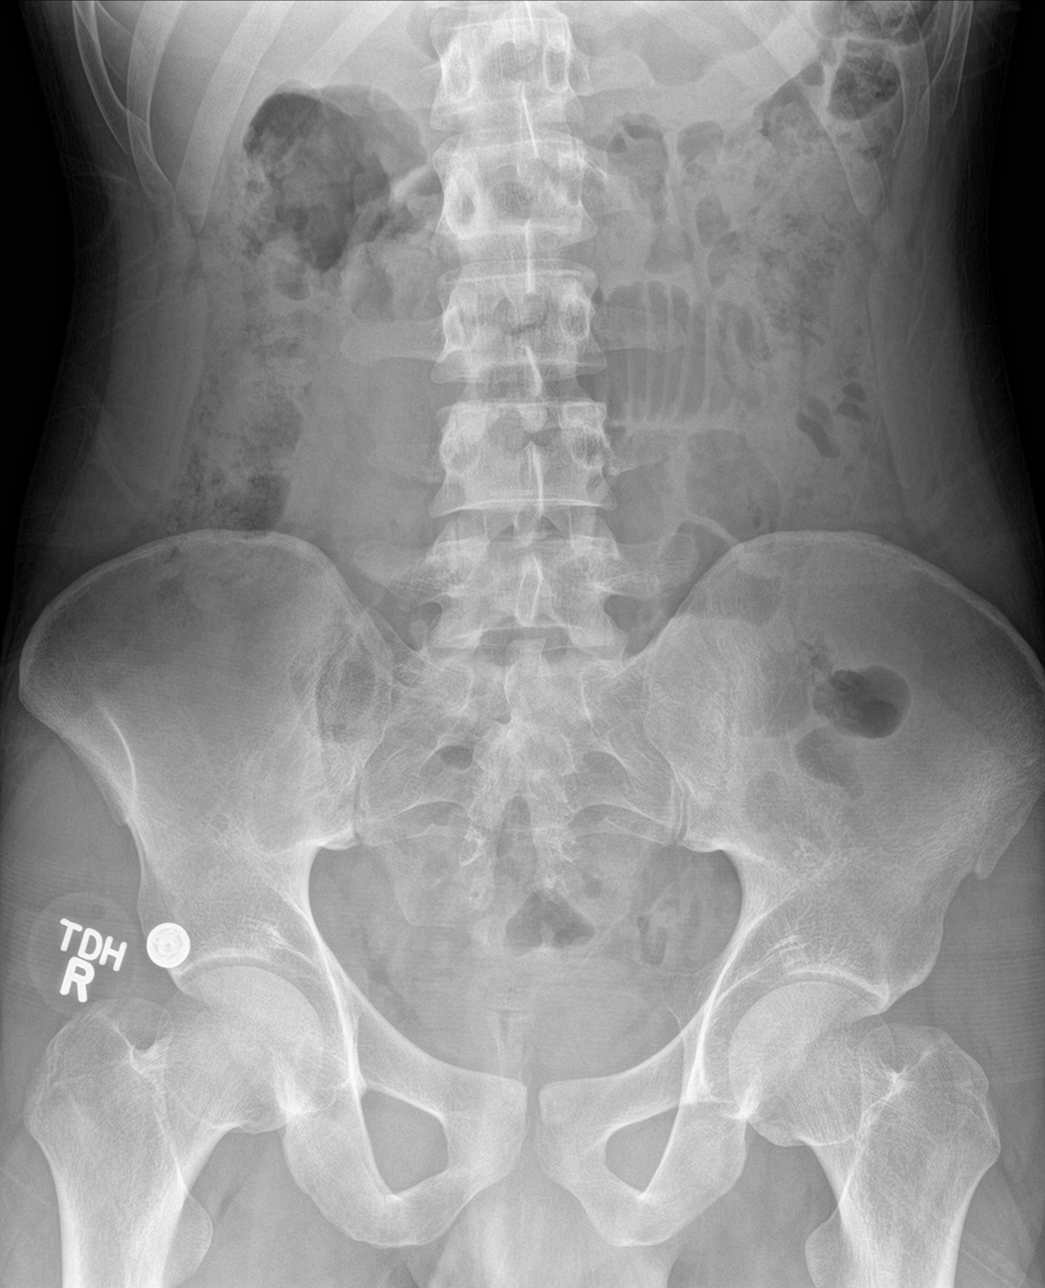
[im 2/2]
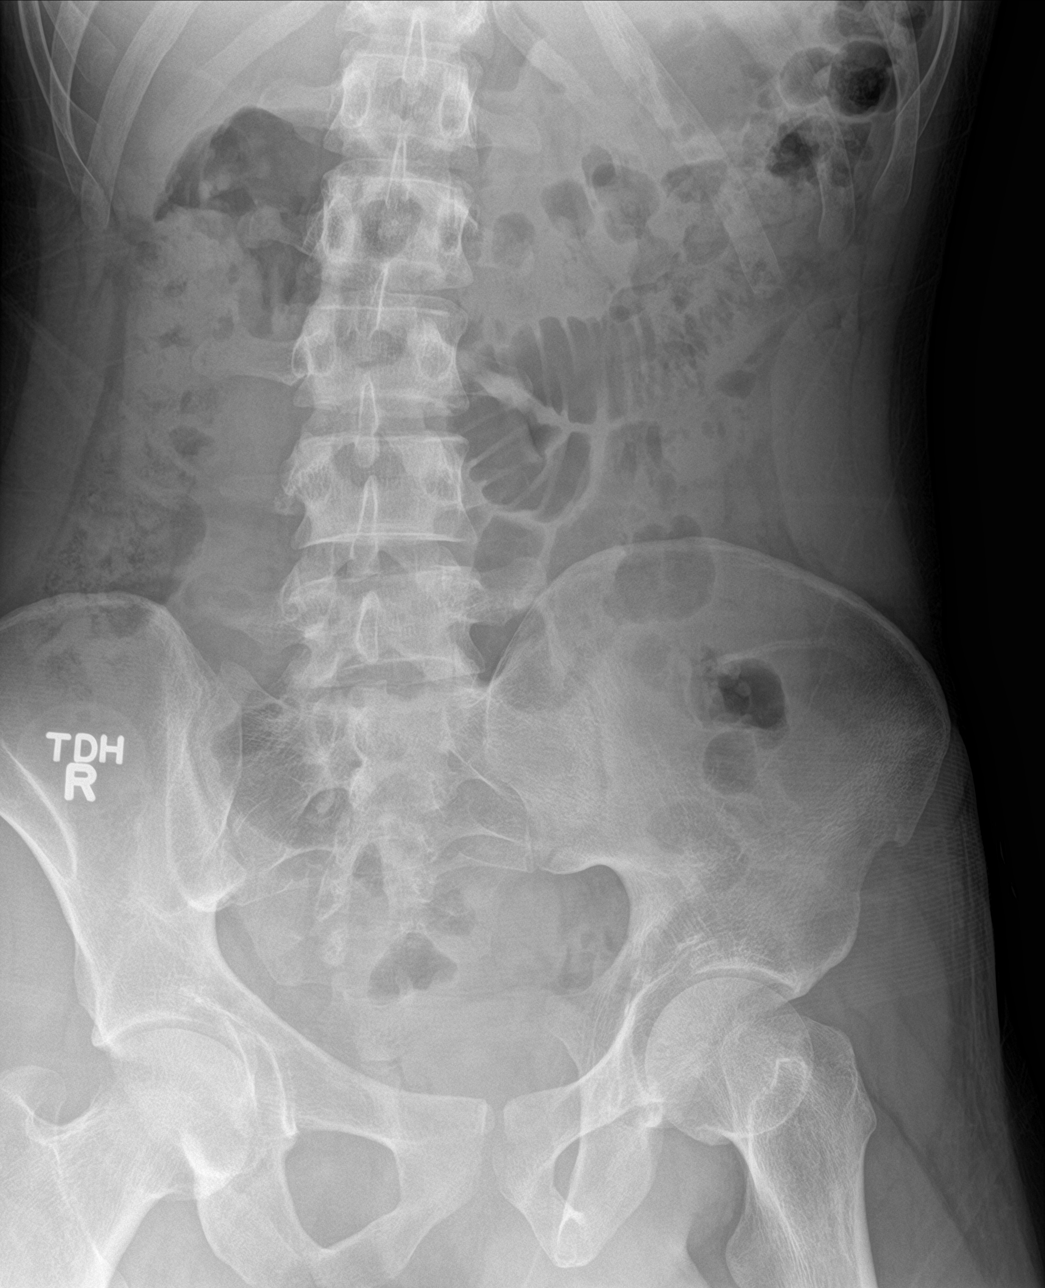

[3 of 3 positions shown; findings below may reference images not displayed]

FINDINGS: Gas and stool throughout the colon without distention. There is a
single gas distended small bowel loop in the mid left abdomen. There
is suggestion of wall thickening. This could indicate localized
ileus due to enteritis or early obstruction. No free intra-abdominal
air. No abnormal air-fluid levels. No radiopaque stones. Visualized
bones and soft tissue contours appear intact.
IMPRESSION: Single gas distended small bowel loop in the mid left abdomen with
suggestion of wall thickening. This could indicate enteritis or
early obstruction.

## 2021-09-18 ENCOUNTER — Ambulatory Visit: Payer: Self-pay

## 2022-02-21 ENCOUNTER — Other Ambulatory Visit: Payer: Self-pay

## 2022-02-21 ENCOUNTER — Emergency Department
Admission: EM | Admit: 2022-02-21 | Discharge: 2022-02-21 | Disposition: A | Discharge status: Home / Self Care | Payer: Self-pay | Attending: Student in an Organized Health Care Education/Training Program | Admitting: Student in an Organized Health Care Education/Training Program

## 2022-02-21 DIAGNOSIS — Z113 Encounter for screening for infections with a predominantly sexual mode of transmission: Secondary | ICD-10-CM | POA: Insufficient documentation

## 2022-02-21 LAB — CHLAMYDIA/NGC RT PCR (ARMC ONLY)
Chlamydia Tr: NOT DETECTED
N gonorrhoeae: NOT DETECTED

## 2022-02-21 LAB — HIV ANTIBODY (ROUTINE TESTING W REFLEX): HIV Screen 4th Generation wRfx: NONREACTIVE

## 2022-02-21 NOTE — Discharge Instructions (Addendum)
Please follow up with PCP and the health department.  Return for any worsening symptoms, questions or concerns.

## 2022-02-21 NOTE — ED Provider Triage Note (Signed)
Emergency Medicine Provider Triage Evaluation Note  Roy Matthews , a 26 y.o. male  was evaluated in triage.  Pt complains of having sex with someone he does not know.  Would like to be treated for preventive HIV and tested for all STDs.  Review of Systems  Positive:  Negative:   Physical Exam  Pulse (!) 101   Temp 98.4 F (36.9 C)   Resp 20   Ht 6\' 4"  (1.93 m)   Wt 75 kg   SpO2 96%   BMI 20.13 kg/m  Gen:   Awake, no distress   Resp:  Normal effort  MSK:   Moves extremities without difficulty  Other:    Medical Decision Making  Medically screening exam initiated at 8:16 AM.  Appropriate orders placed.  Rube Sanchez was informed that the remainder of the evaluation will be completed by another provider, this initial triage assessment does not replace that evaluation, and the importance of remaining in the ED until their evaluation is complete.  GC/chlamydia test ordered.  Explained the patient have to wait to speak to a physician which may be a length of up to 7 to 10 hours.   Geraldo Docker, PA-C 02/21/22 [phone]

## 2022-02-21 NOTE — ED Provider Notes (Signed)
[facility] Provider Note    Event Date/Time   First MD Initiated Contact with Patient 02/21/22 [phone]     (approximate)   History   SEXUALLY TRANSMITTED DISEASE   HPI  Roy Matthews is a 26 y.o. male who presents to the ER for STD testing.  Patient states that he had insertive vaginal intercourse with a girl that he did not know well 2 days ago.  States the condom did break.  He denies any symptoms.  States that he is checked fairly frequently.  States he does not have any specific concerns that she is positive for any STD or history but is not certain.       Physical Exam   Triage Vital Signs: ED Triage Vitals  Enc Vitals Group     BP 02/21/22 0817 (!) 157/92     Pulse Rate 02/21/22 0815 (!) 101     Resp 02/21/22 0815 20     Temp 02/21/22 0815 98.4 F (36.9 C)     Temp src --      SpO2 02/21/22 0815 96 %     Weight 02/21/22 0812 165 lb 5.5 oz (75 kg)     Height 02/21/22 0812 6\' 4"  (1.93 m)     Head Circumference --      Peak Flow --      Pain Score 02/21/22 0812 0     Pain Loc --      Pain Edu? --      Excl. in GC? --     Most recent vital signs: Vitals:   02/21/22 0815 02/21/22 0817  BP:  (!) 157/92  Pulse: (!) 101   Resp: 20   Temp: 98.4 F (36.9 C)   SpO2: 96%      Constitutional: Alert  Eyes: Conjunctivae are normal.  Head: Atraumatic. Nose: No congestion/rhinnorhea. Mouth/Throat: Mucous membranes are moist.   Neck: Painless ROM.  Cardiovascular:   Good peripheral circulation. Respiratory: Normal respiratory effort.  No retractions.  Musculoskeletal:  no deformity Neurologic:  MAE spontaneously. No gross focal neurologic deficits are appreciated.  Skin:  Skin is warm, dry and intact. No rash noted. Psychiatric: Mood and affect are normal. Speech and behavior are normal.    ED Results / Procedures / Treatments   Labs (all labs ordered are listed, but only abnormal results are displayed) Labs Reviewed  CHLAMYDIA/NGC  RT PCR ([facility] ONLY)            HIV ANTIBODY (ROUTINE TESTING W REFLEX)     EKG     RADIOLOGY    PROCEDURES:  Critical Care performed:   Procedures   MEDICATIONS ORDERED IN ED: Medications - No data to display   IMPRESSION / MDM / ASSESSMENT AND PLAN / ED COURSE  I reviewed the triage vital signs and the nursing notes.                              Differential diagnosis includes, but is not limited to, STD exposure, STD screening,  Patient presenting to the ER for screening for STD.  States he has not tried the health department.  States that in the past he has been told that he is scheduled to be several weeks out so wanted to be evaluated to get testing today.  He denies any symptoms at this time.  Very low risk exposure by history.  Did consent for HIV testing.  Patient  does appear stable and appropriate for outpatient follow-up.        FINAL CLINICAL IMPRESSION(S) / ED DIAGNOSES   Final diagnoses:  Screening for STD (sexually transmitted disease)     Rx / DC Orders   ED Discharge Orders     None        Note:  This document was prepared using Dragon voice recognition software and may include unintentional dictation errors.    Willy Eddy, MD 02/21/22 [phone]

## 2022-02-21 NOTE — ED Triage Notes (Signed)
Pt reports wants to be tested and treated for every sexually transmitted disease. Pt reports is not having sx's but he messed with a girl he didn't know well.
